# Patient Record
Sex: Male | Born: 1965 | Race: Black or African American | Hispanic: No | Marital: Single | Smoking: Current every day smoker
Health system: Southern US, Community
[De-identification: ages and names within clinical notes are randomized; demographics above are authoritative.]

---

## 2000-02-15 ENCOUNTER — Emergency Department (HOSPITAL_COMMUNITY): Admission: EM | Admit: 2000-02-15 | Discharge: 2000-02-15 | Payer: Self-pay | Admitting: Emergency Medicine

## 2000-02-15 ENCOUNTER — Encounter: Payer: Self-pay | Admitting: Emergency Medicine

## 2001-08-23 ENCOUNTER — Emergency Department (HOSPITAL_COMMUNITY): Admission: EM | Admit: 2001-08-23 | Discharge: 2001-08-23 | Payer: Self-pay | Admitting: *Deleted

## 2011-01-30 ENCOUNTER — Emergency Department (HOSPITAL_COMMUNITY)
Admission: EM | Admit: 2011-01-30 | Discharge: 2011-01-30 | Disposition: A | Discharge status: Home / Self Care | Payer: Self-pay | Attending: Emergency Medicine | Admitting: Emergency Medicine

## 2011-01-30 DIAGNOSIS — K0889 Other specified disorders of teeth and supporting structures: Secondary | ICD-10-CM | POA: Insufficient documentation

## 2011-01-30 DIAGNOSIS — K089 Disorder of teeth and supporting structures, unspecified: Secondary | ICD-10-CM | POA: Insufficient documentation

## 2011-01-30 DIAGNOSIS — K029 Dental caries, unspecified: Secondary | ICD-10-CM | POA: Insufficient documentation

## 2011-09-12 ENCOUNTER — Encounter (HOSPITAL_COMMUNITY): Payer: Self-pay

## 2011-09-12 ENCOUNTER — Emergency Department (HOSPITAL_COMMUNITY)
Admission: EM | Admit: 2011-09-12 | Discharge: 2011-09-12 | Disposition: A | Discharge status: Other Acute Inpatient Hospital | Payer: Self-pay | Attending: Internal Medicine | Admitting: Internal Medicine

## 2011-09-12 ENCOUNTER — Emergency Department (HOSPITAL_COMMUNITY): Payer: Self-pay

## 2011-09-12 ENCOUNTER — Inpatient Hospital Stay (HOSPITAL_COMMUNITY)
Admission: AD | Admit: 2011-09-12 | Discharge: 2011-09-14 | DRG: 066 | Disposition: A | Discharge status: Home / Self Care | Payer: Self-pay | Source: Other Acute Inpatient Hospital | Attending: Internal Medicine | Admitting: Internal Medicine

## 2011-09-12 DIAGNOSIS — R262 Difficulty in walking, not elsewhere classified: Secondary | ICD-10-CM | POA: Insufficient documentation

## 2011-09-12 DIAGNOSIS — E876 Hypokalemia: Secondary | ICD-10-CM | POA: Diagnosis present

## 2011-09-12 DIAGNOSIS — Z7982 Long term (current) use of aspirin: Secondary | ICD-10-CM

## 2011-09-12 DIAGNOSIS — R112 Nausea with vomiting, unspecified: Secondary | ICD-10-CM | POA: Diagnosis present

## 2011-09-12 DIAGNOSIS — R4182 Altered mental status, unspecified: Secondary | ICD-10-CM | POA: Insufficient documentation

## 2011-09-12 DIAGNOSIS — I6350 Cerebral infarction due to unspecified occlusion or stenosis of unspecified cerebral artery: Principal | ICD-10-CM | POA: Diagnosis present

## 2011-09-12 DIAGNOSIS — F17200 Nicotine dependence, unspecified, uncomplicated: Secondary | ICD-10-CM | POA: Diagnosis present

## 2011-09-12 DIAGNOSIS — H814 Vertigo of central origin: Secondary | ICD-10-CM | POA: Insufficient documentation

## 2011-09-12 LAB — COMPREHENSIVE METABOLIC PANEL
ALT: 21 U/L (ref 0–53)
Alkaline Phosphatase: 46 U/L (ref 39–117)
CO2: 23 mEq/L (ref 19–32)
Chloride: 105 mEq/L (ref 96–112)
GFR calc Af Amer: >90 mL/min (ref >90)
Glucose, Bld: 177 mg/dL — ABNORMAL HIGH (ref 70–99)
Potassium: 3.4 mEq/L — ABNORMAL LOW (ref 3.5–5.1)
Sodium: 139 mEq/L (ref 135–145)
Total Protein: 5.6 g/dL — ABNORMAL LOW (ref 6.0–8.3)

## 2011-09-12 LAB — CBC
HCT: 42.2 % (ref 39.0–52.0)
Hemoglobin: 14.2 g/dL (ref 13.0–17.0)
MCH: 27.5 pg (ref 26.0–34.0)
MCHC: 33.6 g/dL (ref 30.0–36.0)
MCV: 81.8 fL (ref 78.0–100.0)
Platelets: 167 10*3/uL (ref 150–400)
RBC: 5.16 MIL/uL (ref 4.22–5.81)
RDW: 13.2 % (ref 11.5–15.5)
WBC: 7.3 10*3/uL (ref 4.0–10.5)

## 2011-09-12 LAB — DIFFERENTIAL
Basophils Absolute: 0 10*3/uL (ref 0.0–0.1)
Basophils Relative: 1 % (ref 0–1)
Eosinophils Absolute: 0.2 10*3/uL (ref 0.0–0.7)
Eosinophils Relative: 2 % (ref 0–5)
Lymphocytes Relative: 24 % (ref 12–46)
Lymphs Abs: 1.8 10*3/uL (ref 0.7–4.0)
Monocytes Absolute: 0.8 10*3/uL (ref 0.1–1.0)
Monocytes Relative: 11 % (ref 3–12)
Neutro Abs: 4.5 10*3/uL (ref 1.7–7.7)
Neutrophils Relative %: 62 % (ref 43–77)

## 2011-09-12 LAB — URINALYSIS, ROUTINE W REFLEX MICROSCOPIC
Bilirubin Urine: NEGATIVE
Hgb urine dipstick: NEGATIVE
Specific Gravity, Urine: 1.025 (ref 1.005–1.030)
Urobilinogen, UA: 1 mg/dL (ref 0.0–1.0)
pH: 6 (ref 5.0–8.0)

## 2011-09-12 LAB — RAPID URINE DRUG SCREEN, HOSP PERFORMED
Barbiturates: NOT DETECTED
Benzodiazepines: NOT DETECTED
Cocaine: NOT DETECTED
Tetrahydrocannabinol: NOT DETECTED

## 2011-09-12 LAB — CARDIAC PANEL(CRET KIN+CKTOT+MB+TROPI)
CK, MB: 6.3 ng/mL (ref 0.3–4.0)
Troponin I: <0.3 ng/mL (ref <0.30)

## 2011-09-12 LAB — SEDIMENTATION RATE: Sed Rate: 2 mm/hr (ref 0–16)

## 2011-09-12 LAB — SALICYLATE LEVEL: Salicylate Lvl: <2 mg/dL — ABNORMAL LOW (ref 2.8–20.0)

## 2011-09-12 LAB — ACETAMINOPHEN LEVEL: Acetaminophen (Tylenol), Serum: <15 ug/mL (ref 10–30)

## 2011-09-12 MED ORDER — GADOBENATE DIMEGLUMINE 529 MG/ML IV SOLN
15.0000 mL | Freq: Once | INTRAVENOUS | Status: AC | PRN
  Administered 2011-09-12: 15 mL via INTRAVENOUS

## 2011-09-13 LAB — HOMOCYSTEINE: Homocysteine: 5.7 umol/L (ref 4.0–15.4)

## 2011-09-13 LAB — COMPREHENSIVE METABOLIC PANEL
AST: 19 U/L (ref 0–37)
BUN: 10 mg/dL (ref 6–23)
CO2: 24 mEq/L (ref 19–32)
Chloride: 111 mEq/L (ref 96–112)
Creatinine, Ser: 0.91 mg/dL (ref 0.50–1.35)
GFR calc non Af Amer: >90 mL/min (ref >90)
Total Bilirubin: 0.1 mg/dL — ABNORMAL LOW (ref 0.3–1.2)

## 2011-09-13 LAB — CBC
HCT: 37.2 % — ABNORMAL LOW (ref 39.0–52.0)
MCHC: 33.6 g/dL (ref 30.0–36.0)
MCV: 81 fL (ref 78.0–100.0)
RBC: 4.59 MIL/uL (ref 4.22–5.81)
WBC: 6.3 10*3/uL (ref 4.0–10.5)

## 2011-09-13 LAB — GLUCOSE, CAPILLARY: Glucose-Capillary: 97 mg/dL (ref 70–99)

## 2011-09-13 LAB — LIPID PANEL
Cholesterol: 127 mg/dL (ref 0–200)
HDL: 43 mg/dL (ref >39)
LDL Cholesterol: 71 mg/dL (ref 0–99)
Triglycerides: 63 mg/dL (ref <150)

## 2011-09-15 LAB — LUPUS ANTICOAGULANT PANEL
DRVVT: 38.9 secs (ref 34.1–42.2)
PTT Lupus Anticoagulant: 39.9 secs (ref 30.0–45.6)

## 2011-09-15 LAB — PROTEIN S ACTIVITY: Protein S Activity: 78 % (ref 69–129)

## 2011-09-15 LAB — BETA-2-GLYCOPROTEIN I ABS, IGG/M/A
Beta-2 Glyco I IgG: 0 G Units (ref <20)
Beta-2-Glycoprotein I IgA: 3 A Units (ref <20)
Beta-2-Glycoprotein I IgM: 2 M Units (ref <20)

## 2011-09-15 LAB — PROTEIN C ACTIVITY: Protein C Activity: 188 % — ABNORMAL HIGH (ref 75–133)

## 2011-09-15 LAB — ANA: Anti Nuclear Antibody(ANA): NEGATIVE

## 2011-09-15 LAB — ANTITHROMBIN III: AntiThromb III Func: 119 % (ref 76–126)

## 2011-09-15 LAB — CARDIOLIPIN ANTIBODIES, IGG, IGM, IGA: Anticardiolipin IgG: 3 GPL U/mL — ABNORMAL LOW (ref <23)

## 2011-09-17 LAB — FACTOR 5 LEIDEN

## 2011-09-19 LAB — CULTURE, BLOOD (ROUTINE X 2)
Culture  Setup Time: 201210130150
Culture: NO GROWTH

## 2011-09-26 NOTE — Discharge Summary (Signed)
NAMEDAARON, [name]:  [id number]  MEDICAL RECORD NO.:  [id number]  LOCATION:  3013                         FACILITY:  [facility]  PHYSICIAN:  Peggye Pitt, M.D. DATE OF BIRTH:  [birth year]  DATE OF ADMISSION:  09/12/2011 DATE OF DISCHARGE:  09/14/2011                              DISCHARGE SUMMARY   PRIMARY CARE PHYSICIAN:  He is currently unassigned.  DISCHARGE DIAGNOSES: 1. Acute cerebellar cerebrovascular accident. 2. Dizziness secondary to cerebrovascular accident, resolved. 3. Nausea and vomiting secondary to cerebrovascular accident,     resolved.  DISCHARGE MEDICATIONS:  Include aspirin 81 mg daily.  DISPOSITION AND FOLLOWUP:  Cameron Williams will be discharged home today in stable and improved condition.  He requires no PT, OT, or ST followup. His 2-D echo has been done; however, results are still pending.  He currently does not have a primary care physician.  I have asked case management to see him prior to discharge to help him find a new primary care physician who can help to follow up on these results.  CONSULTATION THIS HOSPITALIZATION:  [name], MD with Neurology.  IMAGES THIS HOSPITALIZATION:  Include a CT scan of the head on September 12, 2011 that showed mild white matter hypodensities, a nonspecific finding often seen with chronic microangiopathic change.  No definite acute intracranial abnormality.  An MRI of the brain on September 12, 2011 that showed 3 focal areas of acute/subacute nonhemorrhagic infarction involving the right cerebellum.  Extensive subcortical FLAIR hyperintensities within the frontoparietal lobes bilaterally.  The finding is nonspecific, but can be seen in the setting of chronic microvascular ischemia, demyelinating process such as multiple sclerosis, vasculitis, complicated migraines or the sequelae of prior infectious or inflammatory process.  There is also a right maxillary sinusitis.  An MRA of the head  that showed no significant proximal stenosis or branch vessel occlusion.  An MRA of the neck that was negative.  HISTORY AND PHYSICAL:  For full details, please refer to dictation on September 12, 2011 by Dr. Sunnie Nielsen.  However, in brief, Cameron Williams is a 45 year old black gentleman who presented to the hospital with complaints of dizziness, nausea, vomiting.  He also had some difficulty walking, leaning towards the left.  He felt like everything was spinning around him.  His symptoms probably resolved; however, he came into the emergency department while he was diagnosed with an acute CVA and we were asked to admit him for further evaluation and management.  HOSPITAL COURSE BY PROBLEM: 1. Acute cerebellar CVA.  This could certainly account for his     symptoms of dizziness, nausea, and vomiting.  So far, his workup     has been negative.  His fasting lipid panel has an LDL of 71, hence     he does not require any statins at this point.  HIV has been     negative, preliminary vasculitic workup including ESR and ANA are     negative.  A hypercoagulable panel has been ordered, however, is     pending at this time.  His MRI of neck did not show any evidence     for carotid obstruction.  A 2-D echo has been  done; however,     results are pending at the time of discharge.  He has been     evaluated by PT, OT, and speech therapy.  He does not have any     deficits and does not require any outpatient followup.  He has been     started on aspirin 81 mg daily, which he was not taking     prior to admission. 2. Vital signs on the day of discharge, blood pressure 115/70, heart     rate 59, respirations 16, sats are 98% on room air with temp of     98.5.     Peggye Pitt, M.D.     EH/MEDQ  D:  09/14/2011  T:  09/14/2011  Job:  [id number]  cc:   [name], MD  Electronically Signed by Peggye Pitt M.D. on 09/26/2011 03:47:42 PM

## 2011-10-01 NOTE — H&P (Signed)
Cameron Williams, Cameron NO.:  Williams  MEDICAL RECORD NO.:  [id number]  LOCATION:  WLED                         FACILITY:  [facility]  PHYSICIAN:  Hartley Barefoot, MD    DATE OF BIRTH:  [birth year]  DATE OF ADMISSION:  09/12/2011 DATE OF DISCHARGE:                             HISTORY & PHYSICAL   CHIEF COMPLAINT:  Dizziness.  HISTORY OF PRESENT ILLNESS:  This is a 45 year old with no significant past medical history, who presented to the emergency department complaining of dizziness.  He related that when he wake up in the morning, he went to the bathroom and when he was walking, he become dizzy, leaning to the left side, unable to walk.  He related that everything was spinning around him.  He started to have some vomiting. He called his sister for help.  He felt that he lost his balance and he was not able to stop vomiting.  That was the history that he described to the emergency department.  Then he relate to me that he wake up around 2:00 a.m. and he went to urinate and then he went back to his bed and lie down, then he stand up again and he was having abdominal pain and then he started to walk and he started to go into the left side, having dizziness and poor balance.  Denies speech problem.  He said that he was talking slow because he did not want to vomit.  He felt that everything was moving around him.  He denies any headaches.  His abdominal pain has resolved.  The nausea and vomiting has also resolved.  ALLERGIES:  No known drug allergies.  PAST MEDICAL HISTORY:  Enlarged prostate gland.  MEDICATIONS:  None, although he took a pain medication from his wife the night prior to admission.  SOCIAL HISTORY:  He smokes half a pack per day for the last 6 years.  He drinks 8 beers per month.  No recreational drugs.  FAMILY HISTORY:  No history of stroke or clotting disorder.  He does not know any history about his father.  He does not have any children.   He has 2 sisters and they are healthy.  He is unemployed, but he is looking for job.  PHYSICAL EXAMINATION:  VITAL SIGNS:  Blood pressure 115/64, pulse 70, respiration 18, temperature 97.7, and saturating 98% on room air. GENERAL:  Sitting in bed in no acute distress. HEENT:  Head atraumatic and normocephalic.  Eyes anicteric.  Pupils equal, reactive to light.  Extraocular muscles intact.  Visual fields normal.  Face symmetrical.  Tongue midline.  Uvula is midline.  No dysarthria or aphasia. CARDIOVASCULAR:  S1, S2.  Regular rhythm and rate.  No rubs, murmurs, or gallops. LUNGS:  Bilateral good air movement.  No wheezing, crackles or rhonchi. ABDOMEN:  Bowel sounds positive.  Soft, nontender, nondistended. EXTREMITY:  No edema. NEUROLOGIC:  Awake and oriented x3.  Cranial nerves 2 through 12 intact. Sensation grossly intact.  Motor strength 5/5.  He feels that his balance problem is better, no more dizziness and improved since 2 hours ago.  It seems that when Neurology saw him, he was still having some  balance problems.  ADMISSION LABS:  UDS positive for opioids.  UA negative.  Alcohol less than 11.  Sodium 139, potassium 3.4, chloride 105, bicarb 23, glucose 177, BUN 14, creatinine 1.07, bilirubin 0.1, alkaline phosphatase 46, AST 31, ALT 21, total protein 5.6, albumin 3.0, calcium 8.8, salicylate less than 2, Tylenol less than 50.  White blood cell 7.3, hemoglobin 14.2, platelets 167, ANC 4.5.  CT head show mild white matter hypodensity and nonspecific finding often seen with chronic microangiopathic changes.  No definite acute intracranial abnormality identified.  Partially opacified right maxillary sinus, correlate clinically if concern for acute sinusitis. 1. MRA of the neck, negative MRA of the neck. 2. MRA of the head; no significant proximal stenosis or branch vessel     occlusion.  Tiny out portion of the right posterior communicating     artery may represent a less than 1  mm aneurysm.  Tiny infundibulum     of the left posterior communicating artery. 3. MRI of the brain; 3 focal area of acute, subacute, nonhemorrhagic     infarct involving the right cerebellum.  Extensive subcortical     T2/FLAIR hyperintensity within the frontal parietal lobes     bilaterally.  The finding is nonspecific, but can be seen in the     setting of chronic microvascular ischemia, demyelinating process     such as multiple sclerosis, vasculitis, complicated migraine.  ASSESSMENT AND PLAN: 1. Acute subacute nonhemorrhagic cerebellum stroke.  We will admit the     patient to neuro telemetry.  We will do a workup for a stroke.  We     will check hypercoagulable panel due to young age of presentation.     I will check ESR and ANA for autoimmune disease.  We will also check 2D echo     carotid Dopplers, hemoglobin A1c, fasting lipid panel.  We will     start aspirin.  Neurology already consulted.  PT/OT and speech and     swallow evaluation. 2. Hypokalemia.  We will replete with 20 mEq.  We will check mag     level. 3. For deep venous thrombosis prophylaxis, Lovenox.     Hartley Barefoot, MD     BR/MEDQ  D:  09/12/2011  T:  09/12/2011  Job:  [id number]  Electronically Signed by Hartley Barefoot MD on 10/01/2011 08:22:23 PM

## 2011-10-01 NOTE — Consult Note (Signed)
Cameron Williams, [name] NO.:  [id number]  MEDICAL RECORD NO.:  [id number]  LOCATION:  WLED                         FACILITY:  [facility]  PHYSICIAN:  [name], MD   DATE OF BIRTH:  12-17-1965  DATE OF CONSULTATION: DATE OF DISCHARGE:                                CONSULTATION   This note is dictated on behalf of Dr. [name].  TIME:  9:00.  REASON FOR CONSULTATION:  Ataxia.  HISTORY OF PRESENT ILLNESS:  This is a 45 year old African American male who has no past medical history other than a recently diagnosed enlarged prostate.  The patient is on no medications.  However, he does state that he does takes his wife's narcotics for his joint pain.  Last night, he was at his baseline, went to sleep.  When he got up early in the morning to go to the bathroom, he noted that he felt very much off balance.  He also felt significantly diaphoreticand noted tunnel vision followed by an episode of emesis.  After the episode of emesis, the patient's wife called EMS.  Since in ED, he feels significantly improved; however, he states his gait is still off.  He describes a feeling of vereing to the LEFT.  He denies any room spinning, any auditory rushing or auditory sounds. He denies any vertiginous sensation. On exam, the patient shows no localizing neurological abnormalities; however, when walking with gait, he is able to walk without any difficulty, but when asked to stand on one foot he has slight difficulty with left foot, but does not need to touch down with the either foot.  With heel-to-shin, I note that he will sway back and forth, but is able to keep his balance without any difficulty.  PAST MEDICAL HISTORY:  As noted above.  MEDICATIONS:  He is on no home medications, but does state he takes his wife's narcotics intermittently.  ALLERGIES:  No known drug allergies.  SOCIAL HISTORY:  He smokes 1 pack per day.  He does drink alcohol.  He denies  illicit drugs.  He lives with his wife and is going through a divorce.  REVIEW OF SYSTEMS:  Negative.  PHYSICAL EXAMINATION:  VITAL SIGNS:  Blood pressure is 115/64, pulse 70, respirations 18, temperature 97.7. GENERAL:  The patient is alert and oriented x3.  Carries out 2-step commands without any difficulty.  Pupils are equal, round, reactive to light and accommodating conjugate.  Extraocular movements are intact. Visual fields grossly intact.  Face symmetrical.  Tongue is midline. Uvula is midline.  The patient shows no dysarthria or aphasia.  Facial sensation V1 through V3 is full.  The shoulder shrug, head turn is within normal limits. COORDINATION:  Finger-to-nose, heel-to-shin are smooth.  Fine motor movements are smooth.  Motor 5/5 throughout.  Deep tendon reflexes are 2+ throughout, downgoing toes.  Drift is negative.  Sensation is full to pinprick, light touch.  On gait, the patient has a narrow based gait bilaterally.  He is able to stand on one foot without touching down with the other foot.  His heel-to-toe is slightly off showing an astasia- abasia like quality  where he will sway from side to  side, but does not actually need to step sideways.  He only stepped sideways once during the exam.  LABS:  The patient was positive for opiates.  Urinalysis was negative. Sodium 139, potassium 3.4, chloride 105, CO2 was 23, BUN 14, creatinine 1.07, glucose 117.  White blood cell count is 7.3, hemoglobin is 14.2, hematocrit 42.2, platelets 132.  Imaging, head CT was negative.  ASSESSMENT:  This is a 45 year old male with balance difficulty, nausea, vomiting, presyncopal event early in the morning upon wakening.  The patient's symptoms have all but resolved with the exception of some gait difficulties.  The patient denies any vertiginous sensation, any numbness, tingling.  He denies any auditory abnormalities.  DIFFERENTIAL DIAGNOSIS:  Includes possible posterior  vertebrobasilar pathology versus intracranial pathology.  RECOMMENDATIONS: 1. Agree with MRI, MRA of brain, MRA of neck to rule out intracranial     pathology, possible vertebrobasilar insufficiency or posterior     circulation stroke. 2. PT to evaluate gait.     [name], PA-C    I evaluated patient and agree with assessment and plan.    ______________________________ [name], MD    DS/MEDQ  D:  09/12/2011  T:  09/12/2011  Job:  [id number]  Electronically Signed by [name] PA-C on 09/22/2011 01:07:26 PM Electronically Signed by [name]  on 10/01/2011 09:50:48 AM

## 2013-11-26 ENCOUNTER — Emergency Department (HOSPITAL_COMMUNITY): Payer: Worker's Compensation

## 2013-11-26 ENCOUNTER — Encounter (HOSPITAL_COMMUNITY): Payer: Self-pay | Admitting: Emergency Medicine

## 2013-11-26 ENCOUNTER — Emergency Department (HOSPITAL_COMMUNITY)
Admission: EM | Admit: 2013-11-26 | Discharge: 2013-11-26 | Disposition: A | Discharge status: Home / Self Care | Payer: Worker's Compensation | Attending: Emergency Medicine | Admitting: Emergency Medicine

## 2013-11-26 DIAGNOSIS — S62639A Displaced fracture of distal phalanx of unspecified finger, initial encounter for closed fracture: Secondary | ICD-10-CM | POA: Insufficient documentation

## 2013-11-26 DIAGNOSIS — W208XXA Other cause of strike by thrown, projected or falling object, initial encounter: Secondary | ICD-10-CM | POA: Insufficient documentation

## 2013-11-26 DIAGNOSIS — F17200 Nicotine dependence, unspecified, uncomplicated: Secondary | ICD-10-CM | POA: Insufficient documentation

## 2013-11-26 DIAGNOSIS — Y9289 Other specified places as the place of occurrence of the external cause: Secondary | ICD-10-CM | POA: Insufficient documentation

## 2013-11-26 DIAGNOSIS — Y9389 Activity, other specified: Secondary | ICD-10-CM | POA: Insufficient documentation

## 2013-11-26 DIAGNOSIS — S61209A Unspecified open wound of unspecified finger without damage to nail, initial encounter: Secondary | ICD-10-CM | POA: Insufficient documentation

## 2013-11-26 DIAGNOSIS — Y990 Civilian activity done for income or pay: Secondary | ICD-10-CM | POA: Insufficient documentation

## 2013-11-26 MED ORDER — CEPHALEXIN 500 MG PO CAPS
500.0000 mg | ORAL_CAPSULE | Freq: Once | ORAL | Status: AC
  Administered 2013-11-26: 500 mg via ORAL
  Filled 2013-11-26: qty 1

## 2013-11-26 MED ORDER — CEPHALEXIN 500 MG PO CAPS: 500.0000 mg | ORAL_CAPSULE | Freq: Four times a day (QID) | ORAL | Status: DC

## 2013-11-26 MED ORDER — OXYCODONE-ACETAMINOPHEN 5-325 MG PO TABS: 1.0000 | ORAL_TABLET | Freq: Four times a day (QID) | ORAL | Status: DC | PRN

## 2013-11-26 MED ORDER — HYDROCODONE-ACETAMINOPHEN 5-325 MG PO TABS
1.0000 | ORAL_TABLET | Freq: Once | ORAL | Status: AC | PRN
  Administered 2013-11-26: 1 via ORAL
  Filled 2013-11-26: qty 1

## 2013-11-26 MED ORDER — OXYCODONE-ACETAMINOPHEN 5-325 MG PO TABS
1.0000 | ORAL_TABLET | Freq: Once | ORAL | Status: AC
  Administered 2013-11-26: 1 via ORAL
  Filled 2013-11-26: qty 1

## 2013-11-26 MED ORDER — CHLORHEXIDINE GLUCONATE 4 % EX LIQD
Freq: Once | CUTANEOUS | Status: DC
  Filled 2013-11-26 (×2): qty 60

## 2013-11-26 MED ORDER — NAPROXEN 500 MG PO TABS: 500.0000 mg | ORAL_TABLET | Freq: Two times a day (BID) | ORAL | Status: DC

## 2013-11-26 NOTE — ED Provider Notes (Signed)
CSN: [id number]     Arrival date & time 11/26/13  1316 History  This chart was scribed for non-physician practitioner, Rhea Bleacher, PA-C working with Junius Argyle, MD by Greggory Stallion, ED scribe. This patient was seen in room WTR5/WTR5 and the patient's care was started at 3:18 PM.   Chief Complaint  Patient presents with  . Finger Injury    rt   The history is provided by the patient. No language interpreter was used.   HPI Comments: Cameron Williams is a 47 y.o. male who presents to the Emergency Department complaining of right second, third and fourth finger injury that occurred earlier today at work. States a 750 pound frame fell onto his finger. He has sudden onset finger pain with associated swelling.   History reviewed. No pertinent past medical history. History reviewed. No pertinent past surgical history. No family history on file. History  Substance Use Topics  . Smoking status: Current Every Day Smoker  . Smokeless tobacco: Not on file  . Alcohol Use: Yes    Review of Systems  Constitutional: Positive for activity change.  Musculoskeletal: Positive for arthralgias. Negative for back pain, gait problem, joint swelling and neck pain.  Skin: Positive for wound.  Neurological: Negative for weakness and numbness.    Allergies  Review of patient's allergies indicates no known allergies.  Home Medications   Current Outpatient Rx  Name  Route  Sig  Dispense  Refill  . HYDROcodone-acetaminophen (LORTAB) 7.5-500 MG per tablet   Oral   Take 1 tablet by mouth every 6 (six) hours as needed for pain.         Marland Kitchen ibuprofen (ADVIL,MOTRIN) 200 MG tablet   Oral   Take 800 mg by mouth every 6 (six) hours as needed for mild pain or moderate pain.          BP 138/101  Pulse 65  Temp(Src) 98.6 F (37 C) (Oral)  Resp 20  Wt 160 lb (72.576 kg)  SpO2 95%  Physical Exam  Nursing note and vitals reviewed. Constitutional: He appears well-developed and well-nourished.  No distress.  HENT:  Head: Normocephalic and atraumatic.  Eyes: Conjunctivae and EOM are normal.  Neck: Normal range of motion. Neck supple. No tracheal deviation present.  Cardiovascular: Normal rate and normal pulses.   Pulmonary/Chest: Effort normal. No respiratory distress.  Musculoskeletal: He exhibits edema and tenderness.       Right elbow: Normal.      Right wrist: Normal. He exhibits normal range of motion, no tenderness and no bony tenderness.       Right hand: He exhibits tenderness, bony tenderness, laceration and swelling. He exhibits normal range of motion and normal capillary refill. Normal sensation noted. Normal strength noted.       Hands: Tenderness and swelling of right index, long, and ring fingertips. There are small (<55mm, non-gaping) lacerations overlying the ulnar aspect of the long finger, and radial aspect of the ring finger, lateral to nailbed, not involving nailbed. Nail plates are intact. Mild oozing. Appear clean however it is impossible to gauge depth given small size. Open fracture is unlikely but cannot be ruled out. Normal cap refill. Patient has ROM with pain.   Neurological: He is alert. No sensory deficit.  Motor, sensation, and vascular distal to the injury is fully intact.   Skin: Skin is warm and dry.  Psychiatric: He has a normal mood and affect. His behavior is normal.    ED Course  Procedures (  including critical care time)  DIAGNOSTIC STUDIES: Oxygen Saturation is 95% on RA, adequate by my interpretation.    COORDINATION OF CARE: 3:19 PM-Discussed treatment plan which includes antibiotic, pain medication and finger splints with pt at bedside and pt agreed to plan.   Labs Review Labs Reviewed - No data to display Imaging Review Dg Hand Complete Right  11/26/2013   CLINICAL DATA:  Laceration right ring finger.  EXAM: RIGHT HAND - COMPLETE 3+ VIEW  COMPARISON:  None.  FINDINGS: The patient has fractures of the tufts of the right long and ring  fingers. No foreign body is identified. No other acute bony or joint abnormality is seen.  IMPRESSION: Fractures of the tufts of the long and ring fingers on the right.   Electronically Signed   By: Drusilla Kanner M.D.   On: 11/26/2013 15:04    EKG Interpretation   None      Patient seen and examined. Wounds cleaned. Pt informed of x-ray results. Medications ordered including keflex and pain medication. Splints by ortho tech.   Vital signs reviewed and are as follows: Filed Vitals:   11/26/13 1352  BP: 138/101  Pulse: 65  Temp: 98.6 F (37 C)  Resp: 20   Patient counseled on wound care. Patient counseled to f/u with Dr. Merlyn Lot given possibility of open fracture to ensure proper wound and fracture healing.  Patient was urged to return to the Emergency Department urgently with worsening pain, swelling, expanding erythema especially if it streaks away from the affected area, fever, or if they have any other concerns. Patient verbalized understanding.   Patient counseled on use of narcotic pain medications. Counseled not to combine these medications with others containing tylenol. Urged not to drink alcohol, drive, or perform any other activities that requires focus while taking these medications. The patient verbalizes understanding and agrees with the plan.   MDM   1. Closed fracture of tuft of distal phalanx of finger, initial encounter    Tuft fractures: definitive fracture care provided with finger splinting.   Small lacerations: Cannot rule out open fracture per above. These wounds are non-gaping and there is so much swelling I do not feel placement of a small suture would be beneficial at this point. The suture would have to be pulled very tightly and I do not want to force wound edges together at the expense of tissue damage. Wounds will likely heal well but patient can be considered for delayed primary closure by orthopedist.   I personally performed the services described in  this documentation, which was scribed in my presence. The recorded information has been reviewed and is accurate.   Renne Crigler, PA-C 11/26/13 1720

## 2013-11-26 NOTE — ED Notes (Signed)
wound

## 2013-11-26 NOTE — ED Notes (Signed)
PTA 750# frame come down on rt fingers, digits 2,3&4 involved, digits 3 & 4 bleeding, controlled

## 2013-11-27 NOTE — ED Provider Notes (Signed)
Medical screening examination/treatment/procedure(s) were performed by non-physician practitioner and as supervising physician I was immediately available for consultation/collaboration.  EKG Interpretation   None         Mayola Mcbain S Latrelle Fuston, MD 11/27/13 1201 

## 2013-12-01 ENCOUNTER — Emergency Department (HOSPITAL_COMMUNITY): Payer: Worker's Compensation

## 2013-12-01 ENCOUNTER — Emergency Department (HOSPITAL_COMMUNITY)
Admission: EM | Admit: 2013-12-01 | Discharge: 2013-12-01 | Disposition: A | Discharge status: Home / Self Care | Payer: Worker's Compensation | Attending: Emergency Medicine | Admitting: Emergency Medicine

## 2013-12-01 ENCOUNTER — Encounter (HOSPITAL_COMMUNITY): Payer: Self-pay | Admitting: Emergency Medicine

## 2013-12-01 DIAGNOSIS — Z792 Long term (current) use of antibiotics: Secondary | ICD-10-CM | POA: Insufficient documentation

## 2013-12-01 DIAGNOSIS — M25549 Pain in joints of unspecified hand: Secondary | ICD-10-CM | POA: Insufficient documentation

## 2013-12-01 DIAGNOSIS — F17200 Nicotine dependence, unspecified, uncomplicated: Secondary | ICD-10-CM | POA: Insufficient documentation

## 2013-12-01 DIAGNOSIS — M79644 Pain in right finger(s): Secondary | ICD-10-CM

## 2013-12-01 DIAGNOSIS — R209 Unspecified disturbances of skin sensation: Secondary | ICD-10-CM | POA: Insufficient documentation

## 2013-12-01 DIAGNOSIS — G8911 Acute pain due to trauma: Secondary | ICD-10-CM | POA: Insufficient documentation

## 2013-12-01 DIAGNOSIS — Z791 Long term (current) use of non-steroidal anti-inflammatories (NSAID): Secondary | ICD-10-CM | POA: Insufficient documentation

## 2013-12-01 MED ORDER — OXYCODONE-ACETAMINOPHEN 5-325 MG PO TABS: 1.0000 | ORAL_TABLET | ORAL | Status: DC | PRN

## 2013-12-01 MED ORDER — OXYCODONE-ACETAMINOPHEN 5-325 MG PO TABS
1.0000 | ORAL_TABLET | Freq: Once | ORAL | Status: AC
  Administered 2013-12-01: 1 via ORAL
  Filled 2013-12-01: qty 1

## 2013-12-01 NOTE — ED Notes (Signed)
Pt arrived to tED with a complaint of finger pain.  Pt crushed fingers under a large object on Saturday last.  Pt was seen here and at orthopedics who states nail possibly needed to be removed.  Pt didn't have the nails removed and pressure has built to a painful level.

## 2013-12-01 NOTE — ED Provider Notes (Signed)
CSN: [id number]     Arrival date & time 12/01/13  1938 History   None    This chart was scribed for non-physician practitioner, Ruby Colaatherine Smitty Ackerley PA-C working with Flint MelterElliott L Wentz, MD by Arlan OrganAshley Leger, ED Scribe. This patient was seen in room WTR9/WTR9 and the patient's care was started at 9:00 PM.   Chief Complaint  Patient presents with  . Hand Pain   The history is provided by the patient. No language interpreter was used.    HPI Comments: Cameron Williams is a 48 y.o. male who presents to the Emergency Department complaining of a finger pain that initially occurred 5 days ago. Pt states he crushed his finger underneath a large object. He says he was seen here 3 days ago, and by Dr. Nicki ReaperGary R. Kuzma, MD and was told his finger nail may possibly need to be removed. He says he is now experiencing increased gradually worsening constant pain to his fingers due to the pressure underneath his nail along with associated increased swelling and numbness onset 2 days ago. He denies fever or chills. Pt says he was put on Keflex, and has been taking it as prescribed. He reports having about 8 pills remaining.  History reviewed. No pertinent past medical history. History reviewed. No pertinent past surgical history. History reviewed. No pertinent family history. History  Substance Use Topics  . Smoking status: Current Every Day Smoker  . Smokeless tobacco: Not on file  . Alcohol Use: Yes    Review of Systems  All other systems reviewed and are negative.    Allergies  Review of patient's allergies indicates no known allergies.  Home Medications   Current Outpatient Rx  Name  Route  Sig  Dispense  Refill  . cephALEXin (KEFLEX) 500 MG capsule   Oral   Take 1 capsule (500 mg total) by mouth 4 (four) times daily.   28 capsule   0   . HYDROcodone-acetaminophen (LORTAB) 7.5-500 MG per tablet   Oral   Take 1 tablet by mouth every 6 (six) hours as needed for pain.         Marland Kitchen. ibuprofen  (ADVIL,MOTRIN) 200 MG tablet   Oral   Take 800 mg by mouth every 6 (six) hours as needed for mild pain or moderate pain.         . naproxen (NAPROSYN) 500 MG tablet   Oral   Take 1 tablet (500 mg total) by mouth 2 (two) times daily.   20 tablet   0   . oxyCODONE-acetaminophen (PERCOCET/ROXICET) 5-325 MG per tablet   Oral   Take 1-2 tablets by mouth every 6 (six) hours as needed for severe pain.   20 tablet   0    Triage Vitals: BP 130/83  Pulse 66  Temp(Src) 97.7 F (36.5 C) (Oral)  Resp 18  SpO2 99%  Physical Exam  Nursing note and vitals reviewed. Constitutional: He is oriented to person, place, and time. He appears well-developed and well-nourished. No distress.  HENT:  Head: Normocephalic and atraumatic.  Eyes:  Normal appearance  Neck: Normal range of motion.  Pulmonary/Chest: Effort normal.  Musculoskeletal: Normal range of motion.  Healing lac on lateral surface of distal phalanx of ring finger and medial surface of distal phalanx of middle finger.  Erythema on pad of middle finger.  Edema of entire middle finger, worst at distal phalanx.  Tenderness on flexor surface of middle and distal phalanx.  Pt holding this finger in slight flexion,  but has pain w/ guarding w/ passive full extension.  Distal sensation intact.  Ring finger, mildly edematous and ttp at distal phalanx only. Minimal pain w/ ROM. Distal sensation intact.  Neurological: He is alert and oriented to person, place, and time.  Psychiatric: He has a normal mood and affect. His behavior is normal.    ED Course  Procedures (including critical care time)  DIAGNOSTIC STUDIES: Oxygen Saturation is 99% on RA, Normal by my interpretation.    COORDINATION OF CARE: 8:21 PM- Will give pain medication. Discussed treatment plan with pt at bedside and pt agreed to plan.     Labs Review Labs Reviewed - No data to display Imaging Review No results found.  EKG Interpretation   None       MDM   1.  Pain in finger of right hand    47yo healthy M had a crush injury to 3rd and 4th fingers 5 days ago, sustained tuft fractures and lacerations, followed up with Dr, Merlyn Lot 3 days ago and was told to continue Keflex, splint and prn percocet and return to office on 1/12.  Comes to ED today because pain and edema have worsened and there has been intermittent bleeding from lacs.  He has been unable to wear splint because pain w/ full extension of middle finger. Dr. Effie Shy has examined and does not have any concern for flexor tenosynovitis or other infectious process.  I advised patient to f/u w/ Dr. Merlyn Lot asap and return to ER immediately if edema, erythema or pain spreads, there is purulent drainage from wounds or he develops fever.  Prescribed percocet.     I personally performed the services described in this documentation, which was scribed in my presence. The recorded information has been reviewed and is accurate.   [name], PA-C 12/02/13 1946

## 2013-12-01 NOTE — ED Notes (Signed)
Pt was here on 12/27 for a laceration repair and is still in pain; says the finger braces he received are not working

## 2013-12-01 NOTE — ED Provider Notes (Signed)
  Face-to-face evaluation   History: Crush injury 11/26/2013 at work. He's been evaluated by hand surgery, who advised that he comes back 12/12/13 for a check up. He was treated with stack splints on the fingers, but they became uncomfortable, so he stopped using them. He has been soaking the wounds, and fingers twice each day as directed. He is taking his antibiotic. He is here because he is concerned about the wounds, and having pain. He was told that he could get back to work soon.  Physical exam: Fingers, 3 and 4, right hand were evaluated. There is mild swelling of the hands of each finger. There are no subungual hematomas. He has fair flexion at the PIP and DIPs of each of the and fingers there is no significant swelling or erythema along the flexor tendon sheaths of either finger. There is no proximal streaking.  Assessment: Reviewed imaging from prior visit. Exam today is consistent with resolving fractures and healing wounds. His exam does not indicate septic arthritis or tenosynovitis. He is stable for discharge with current treatment plan. There is no need for the stack splints because they are causing pain. They may be helpful after the swelling has improved. He is given precautions to watch for worsening symptoms and advised to continue soaking and aggressively elevate the right hand.  Medical screening examination/treatment/procedure(s) were conducted as a shared visit with non-physician practitioner(s) and myself.  I personally evaluated the patient during the encounter  Flint MelterElliott L Waldemar Siegel, MD 12/03/13 [phone]

## 2013-12-01 NOTE — Discharge Instructions (Signed)
Take percocet as needed for severe pain.  Do not drive within four hours of taking this medication (may cause drowsiness or confusion).   Continue antibiotic as prescribed.  Elevate and ice fingers when possible.  Follow up with Dr. Merlyn LotKuzma as scheduled.  Return to the ER immediately if finger continues to swell, redness spreads, pain worsens, there is drainage of pus from wounds or you develop a fever.

## 2014-02-17 ENCOUNTER — Emergency Department (HOSPITAL_COMMUNITY)
Admission: EM | Admit: 2014-02-17 | Discharge: 2014-02-17 | Disposition: A | Discharge status: Home / Self Care | Payer: Self-pay | Attending: Emergency Medicine | Admitting: Emergency Medicine

## 2014-02-17 ENCOUNTER — Encounter (HOSPITAL_COMMUNITY): Payer: Self-pay | Admitting: Emergency Medicine

## 2014-02-17 ENCOUNTER — Emergency Department (HOSPITAL_COMMUNITY): Payer: Self-pay

## 2014-02-17 DIAGNOSIS — R6889 Other general symptoms and signs: Secondary | ICD-10-CM

## 2014-02-17 DIAGNOSIS — R509 Fever, unspecified: Secondary | ICD-10-CM

## 2014-02-17 DIAGNOSIS — R300 Dysuria: Secondary | ICD-10-CM | POA: Insufficient documentation

## 2014-02-17 DIAGNOSIS — IMO0002 Reserved for concepts with insufficient information to code with codable children: Secondary | ICD-10-CM | POA: Insufficient documentation

## 2014-02-17 DIAGNOSIS — J111 Influenza due to unidentified influenza virus with other respiratory manifestations: Secondary | ICD-10-CM | POA: Insufficient documentation

## 2014-02-17 LAB — URINALYSIS, ROUTINE W REFLEX MICROSCOPIC
Bilirubin Urine: NEGATIVE
GLUCOSE, UA: NEGATIVE mg/dL
Hgb urine dipstick: NEGATIVE
KETONES UR: NEGATIVE mg/dL
LEUKOCYTES UA: NEGATIVE
Nitrite: NEGATIVE
PH: 6.5 (ref 5.0–8.0)
Protein, ur: 30 mg/dL — AB
Specific Gravity, Urine: 1.03 (ref 1.005–1.030)
Urobilinogen, UA: 1 mg/dL (ref 0.0–1.0)

## 2014-02-17 LAB — URINE MICROSCOPIC-ADD ON

## 2014-02-17 MED ORDER — PREDNISONE 20 MG PO TABS: 40.0000 mg | ORAL_TABLET | Freq: Every day | ORAL | Status: AC

## 2014-02-17 MED ORDER — ALBUTEROL SULFATE HFA 108 (90 BASE) MCG/ACT IN AERS
2.0000 | INHALATION_SPRAY | Freq: Once | RESPIRATORY_TRACT | Status: AC
  Administered 2014-02-17: 2 via RESPIRATORY_TRACT
  Filled 2014-02-17: qty 6.7

## 2014-02-17 MED ORDER — GUAIFENESIN 100 MG/5ML PO SYRP: 100.0000 mg | ORAL_SOLUTION | ORAL | Status: AC | PRN

## 2014-02-17 MED ORDER — IBUPROFEN 600 MG PO TABS: 600.0000 mg | ORAL_TABLET | Freq: Four times a day (QID) | ORAL | Status: AC | PRN

## 2014-02-17 MED ORDER — ACETAMINOPHEN 325 MG PO TABS
650.0000 mg | ORAL_TABLET | Freq: Once | ORAL | Status: AC
  Administered 2014-02-17: 650 mg via ORAL
  Filled 2014-02-17: qty 2

## 2014-02-17 NOTE — ED Provider Notes (Signed)
CSN: [id number]     Arrival date & time 02/17/14  2014 History  This chart was scribed for non-physician practitioner Antony Madura, PA-C working with Suzi Roots, MD by Dorothey Baseman, ED Scribe. This patient was seen in room WTR8/WTR8 and the patient's care was started at 10:19 PM.     Chief Complaint  Patient presents with  . Influenza   Patient is a 48 y.o. male presenting with flu symptoms. The history is provided by the patient. No language interpreter was used.  Influenza Presenting symptoms: cough, fatigue, fever, myalgias and shortness of breath   Presenting symptoms: no nausea, no rhinorrhea, no sore throat and no vomiting   Cough:    Cough characteristics:  Dry   Severity:  Mild   Onset quality:  Gradual   Timing:  Intermittent   Progression:  Unchanged   Chronicity:  New Fatigue:    Severity:  Moderate   Timing:  Constant   Progression:  Unchanged Fever:    Timing:  Sporadic   Progression:  Unchanged Shortness of breath:    Severity:  Mild   Onset quality:  Gradual   Timing:  Intermittent   Progression:  Unchanged Severity:  Moderate Onset quality:  Gradual Progression:  Unchanged Chronicity:  New Relieved by:  Nothing Worsened by:  Nothing tried Ineffective treatments:  OTC medications Associated symptoms: no congestion and no neck stiffness   Risk factors: no sick contacts    HPI Comments: Cameron Williams is a 48 y.o. male who presents to the Emergency Department complaining of influenza-like symptoms including fever (Tmax 102.6 measured at home, 103.2 measured upon arrival to the ED, improved to 99.5 after receiving Tylenol in the ED) with associated dry cough, mild shortness of breath, diffuse myalgias, decreased appetite, and fatigue onset 2 days ago. He reports taking Robitussin at home with moderate, temporary relief of the cough. Patient also reports some burning dysuria. He denies sore throat, congestion, rhinorrhea, nausea, emesis, abdominal pain,  hematuria, neck stiffness. He denies any known sick contacts. Patient has no other pertinent medical history.   History reviewed. No pertinent past medical history. History reviewed. No pertinent past surgical history. No family history on file. History  Substance Use Topics  . Smoking status: Current Every Day Smoker  . Smokeless tobacco: Not on file  . Alcohol Use: Yes     Comment: occasional    Review of Systems  Constitutional: Positive for fever, appetite change (decreased) and fatigue.  HENT: Negative for congestion, rhinorrhea and sore throat.   Respiratory: Positive for cough and shortness of breath.   Gastrointestinal: Negative for nausea, vomiting and abdominal pain.  Genitourinary: Positive for dysuria. Negative for hematuria.  Musculoskeletal: Positive for myalgias. Negative for neck stiffness.  All other systems reviewed and are negative.   Allergies  Review of patient's allergies indicates no known allergies.  Home Medications   Current Outpatient Rx  Name  Route  Sig  Dispense  Refill  . traMADol (ULTRAM) 50 MG tablet   Oral   Take 50 mg by mouth every 6 (six) hours as needed (pain.).         Marland Kitchen guaifenesin (ROBITUSSIN) 100 MG/5ML syrup   Oral   Take 5-10 mLs (100-200 mg total) by mouth every 4 (four) hours as needed for cough.   60 mL   0   . ibuprofen (ADVIL,MOTRIN) 600 MG tablet   Oral   Take 1 tablet (600 mg total) by mouth every 6 (six) hours  as needed.   30 tablet   0   . predniSONE (DELTASONE) 20 MG tablet   Oral   Take 2 tablets (40 mg total) by mouth daily.   10 tablet   0    Triage Vitals: BP 125/82  Pulse 90  Temp(Src) 103.2 F (39.6 C) (Oral)  Resp 20  Ht 6\' 2"  (1.88 m)  Wt 170 lb (77.111 kg)  BMI 21.82 kg/m2  SpO2 94%  Physical Exam  Nursing note and vitals reviewed. Constitutional: He is oriented to person, place, and time. He appears well-developed and well-nourished. No distress.  Nontoxic/nonseptic appearing  HENT:   Head: Normocephalic and atraumatic.  Mouth/Throat: Oropharynx is clear and moist. No oropharyngeal exudate.  Oropharynx clear. Patient tolerating secretions without difficulty or drooling  Eyes: Conjunctivae and EOM are normal. Pupils are equal, round, and reactive to light. No scleral icterus.  Neck: Normal range of motion.  No nuchal rigidity or meningismus  Cardiovascular: Normal rate, regular rhythm and normal heart sounds.   Pulmonary/Chest: Effort normal and breath sounds normal. No respiratory distress. He has no wheezes. He has no rales.  No tachypnea, dyspnea, retractions, or accessory muscle use  Abdominal: Soft. He exhibits no distension and no mass. There is no tenderness. There is no rebound and no guarding.  Soft and nontender without peritoneal signs  Musculoskeletal: Normal range of motion.  Neurological: He is alert and oriented to person, place, and time.  GCS 15. Speech goal oriented. Patient moves extremities without ataxia.  Skin: Skin is warm and dry. No rash noted. He is not diaphoretic. No erythema. No pallor.  Psychiatric: He has a normal mood and affect. His behavior is normal.    ED Course  Procedures (including critical care time)  DIAGNOSTIC STUDIES: Oxygen Saturation is 94% on room air, adequate by my interpretation.    COORDINATION OF CARE: 10:25 PM- Ordered Tylenol to manage symptoms. Discussed that chest x-ray indicates some mild bronchitic changes, but was negative for pneumonia. Discussed pros and cons of Tamiflu. Patient agrees that he does not want a prescription for this. Will order UA and an influenza test. Advised of symptomatic care. Discussed treatment plan with patient at bedside and patient verbalized agreement.    Labs Review Labs Reviewed  URINALYSIS, ROUTINE W REFLEX MICROSCOPIC - Abnormal; Notable for the following:    APPearance CLOUDY (*)    Protein, ur 30 (*)    All other components within normal limits  URINE MICROSCOPIC-ADD ON   INFLUENZA PANEL BY PCR (TYPE A & B, H1N1)    Imaging Review Dg Chest 2 View  02/17/2014   CLINICAL DATA:  Two day history of cough, chest congestion, shortness of breath and chest pain.  EXAM: CHEST  2 VIEW  COMPARISON:  None.  FINDINGS: Cardiomediastinal silhouette unremarkable. Mildly prominent bronchovascular markings diffusely and mild central peribronchial thickening. Lungs otherwise clear. No localized airspace consolidation. No pleural effusions. No pneumothorax. Normal pulmonary vascularity. Degenerative changes involving the thoracic spine.  IMPRESSION: Mild changes of bronchitis and/or asthma without localized airspace pneumonia.   Electronically Signed   By: Hulan Saas M.D.   On: 02/17/2014 22:00     EKG Interpretation None      MDM   Final diagnoses:  Flu-like symptoms  Fever    Uncomplicated flulike illness. Patient nontoxic and nonseptic appearing. He is hemodynamically stable. Patient afebrile on arrival to 103.51F. Fever responding to antipyretics. Physical exam unremarkable. Patient with no evidence of focal consolidation or pneumonia on  chest x-ray. No tachypnea, dyspnea, or hypoxia appreciated. No retractions or accessory muscle use. Patient with complaints of dysuria; however, urinalysis today does not suggest infection. Influenza by PCR pending.  Symptoms consistent with viral, flulike illness. Patient appropriate for discharge today with albuterol inhaler, prednisone course, prescription for Robitussin, and instruction to take Advil or Tylenol for fever control and myalgias. Return precautions provided and patient agreeable to plan with no unaddressed concerns.  I personally performed the services described in this documentation, which was scribed in my presence. The recorded information has been reviewed and is accurate.  Filed Vitals:   02/17/14 2039 02/17/14 2104 02/17/14 2224 02/17/14 2347  BP: 125/82   113/71  Pulse: 90   87  Temp: 103.2 F (39.6 C)   99.5 F (37.5 C) 100.3 F (37.9 C)  TempSrc: Oral  Oral Oral  Resp: 20   20  Height:  6\' 2"  (1.88 m)    Weight:  170 lb (77.111 kg)    SpO2: 94%   96%       Antony MaduraKelly Symphony Demuro, PA-C 02/18/14 0020

## 2014-02-17 NOTE — ED Notes (Signed)
Pt c/o generalized body aches, dry cough, HA, fever x 3 days.

## 2014-02-17 NOTE — Discharge Instructions (Signed)

## 2014-02-17 NOTE — ED Notes (Signed)
Pt presents with family with c/o fever, generalized weakness, not able to get out of bed today.

## 2014-02-18 LAB — INFLUENZA PANEL BY PCR (TYPE A & B)
H1N1 flu by pcr: NOT DETECTED
Influenza A By PCR: NEGATIVE
Influenza B By PCR: NEGATIVE

## 2014-02-19 NOTE — ED Provider Notes (Signed)
Medical screening examination/treatment/procedure(s) were performed by non-physician practitioner and as supervising physician I was immediately available for consultation/collaboration.   EKG Interpretation None        Suzi RootsKevin E Mccall Lomax, MD 02/19/14 [phone]

## 2014-07-22 IMAGING — CR DG HAND COMPLETE 3+V*R*
3 series · 3 of 3 positions shown · non-contrast
Comparison: None.

CLINICAL DATA: Laceration right ring finger.

EXAM:
RIGHT HAND - COMPLETE 3+ VIEW

[x hand pa right]
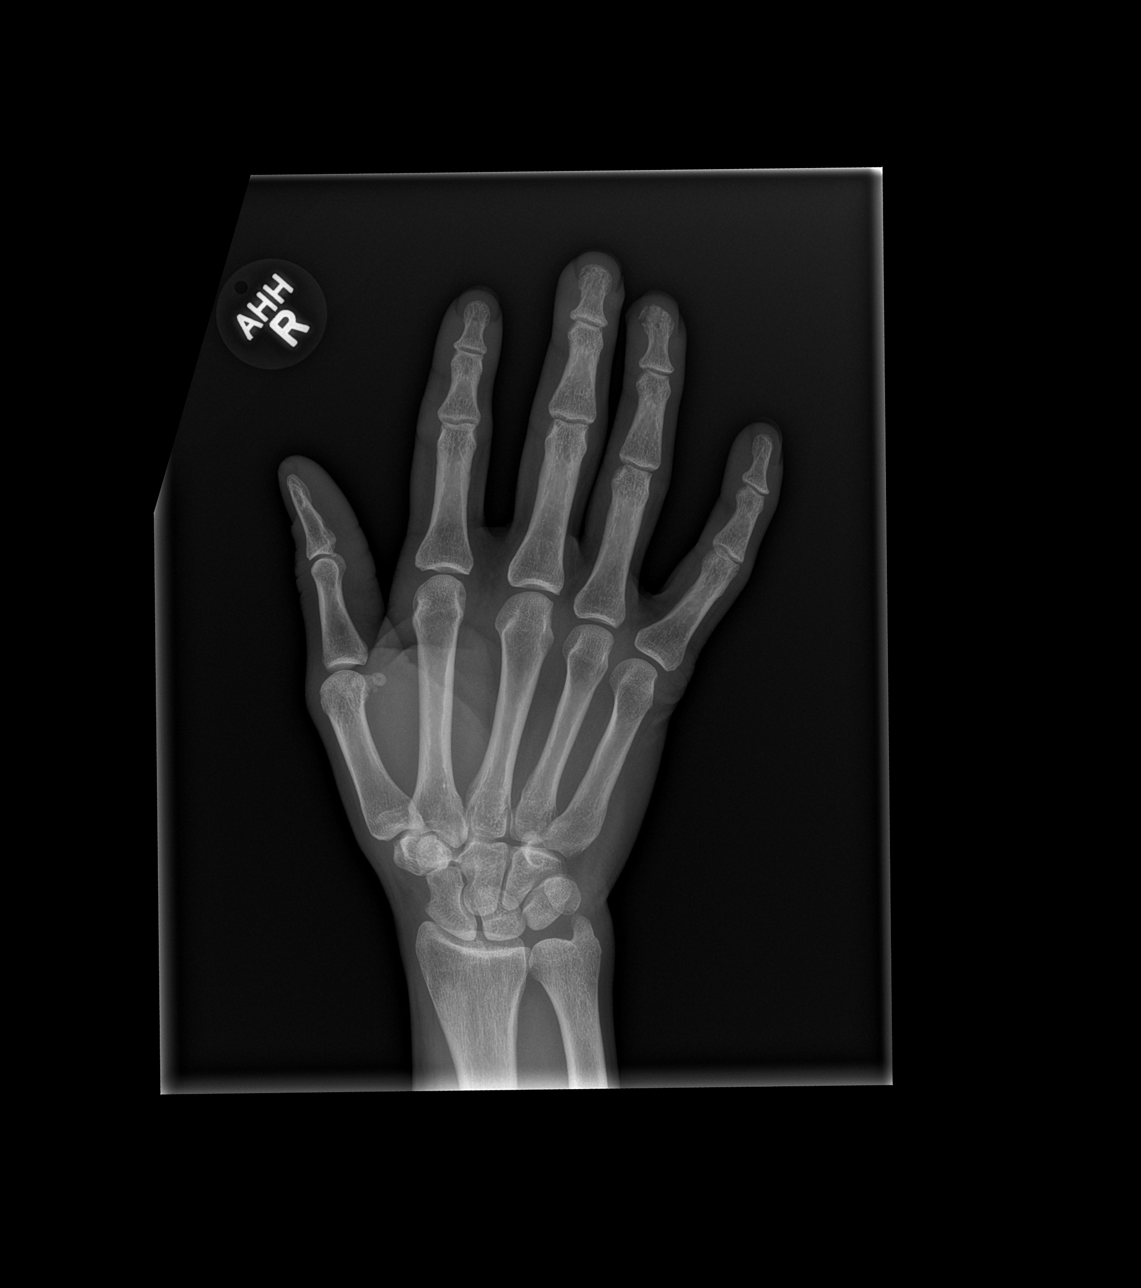

[x hand obl right]
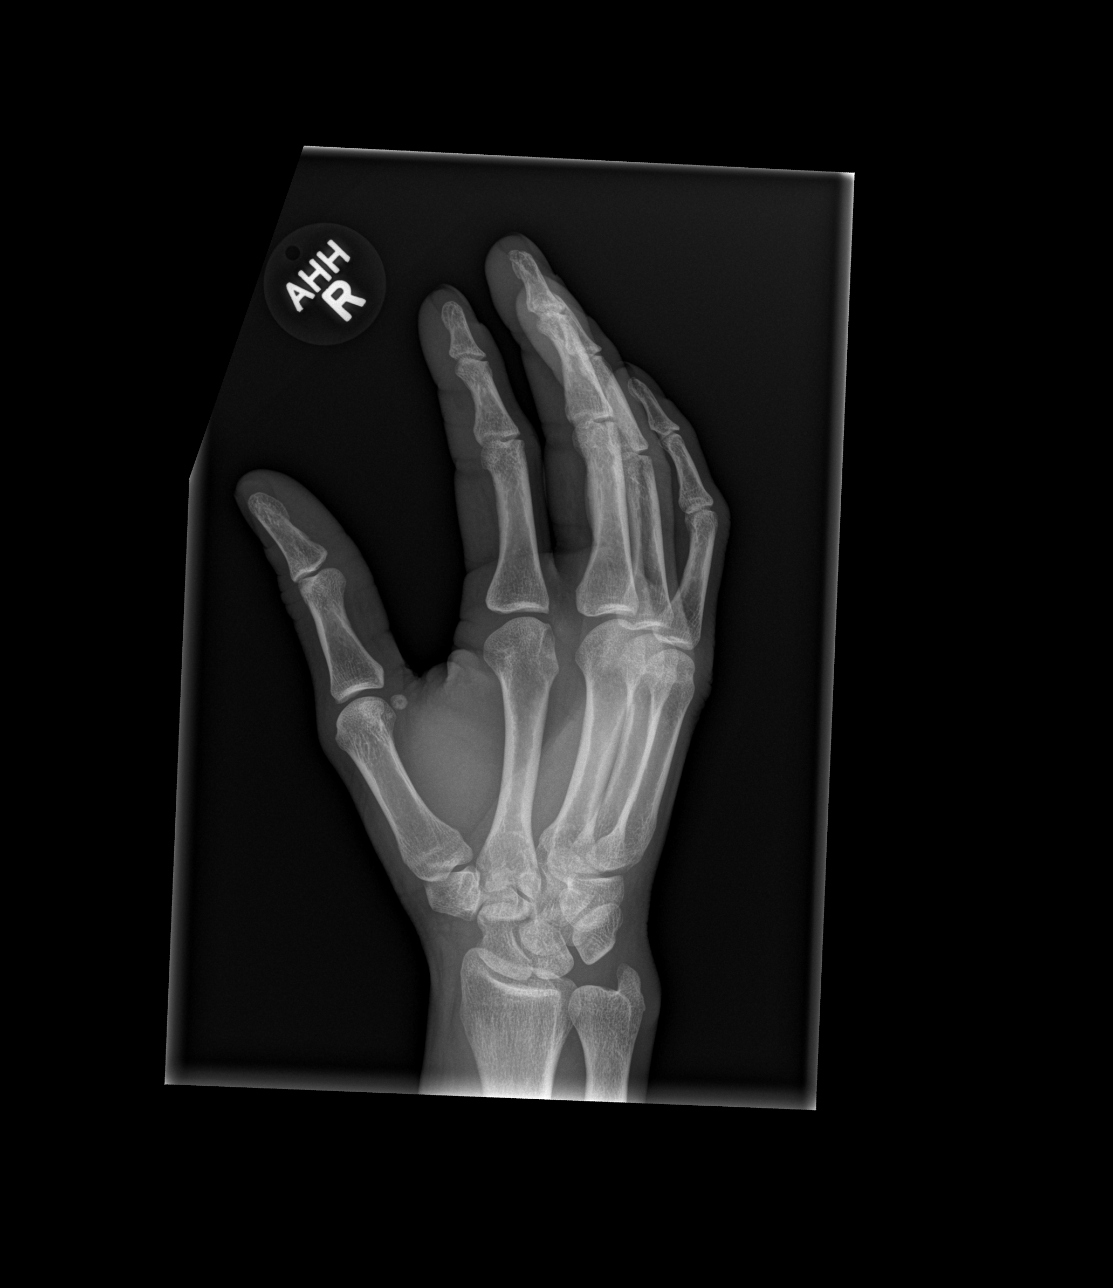

[x hand lat right]
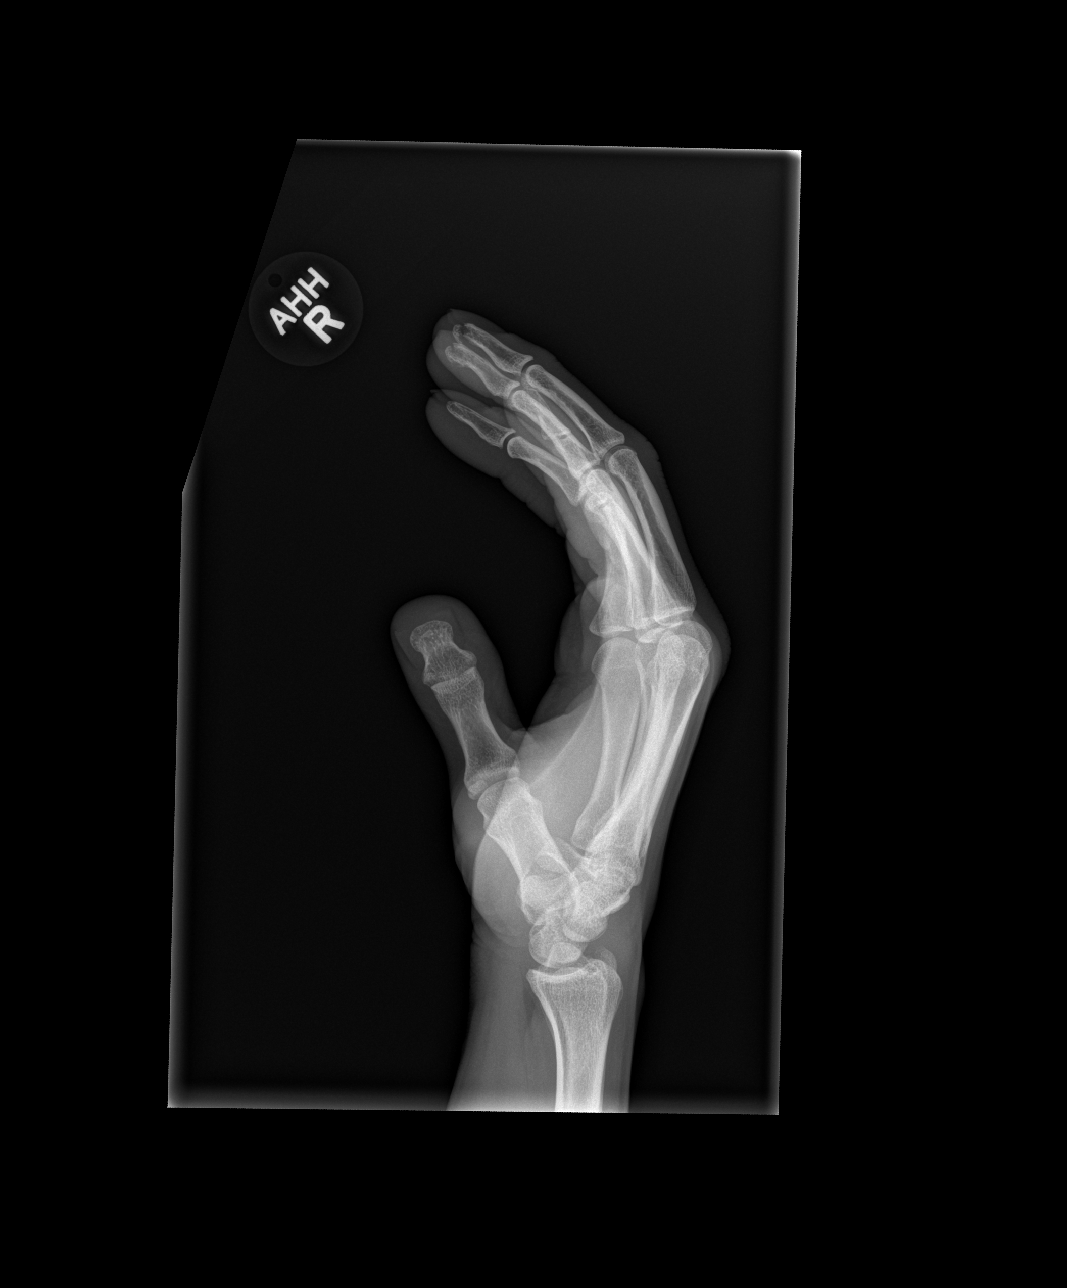

[3 of 3 positions shown; findings below may reference images not displayed]

FINDINGS: The patient has fractures of the [REDACTED] of the right long and ring
fingers. No foreign body is identified. No other acute bony or joint
abnormality is seen.
IMPRESSION: Fractures of the [REDACTED] of the long and ring fingers on the right.

## 2023-08-24 ENCOUNTER — Emergency Department (HOSPITAL_COMMUNITY): Payer: Self-pay

## 2023-08-24 ENCOUNTER — Encounter (HOSPITAL_COMMUNITY): Payer: Self-pay

## 2023-08-24 ENCOUNTER — Emergency Department (HOSPITAL_COMMUNITY)
Admission: EM | Admit: 2023-08-24 | Discharge: 2023-08-24 | Disposition: A | Discharge status: Home / Self Care | Payer: Self-pay | Attending: Emergency Medicine | Admitting: Emergency Medicine

## 2023-08-24 ENCOUNTER — Ambulatory Visit (HOSPITAL_COMMUNITY)
Admission: RE | Admit: 2023-08-24 | Discharge: 2023-08-24 | Disposition: A | Discharge status: Home / Self Care | Payer: Self-pay | Source: Ambulatory Visit | Attending: Emergency Medicine | Admitting: Emergency Medicine

## 2023-08-24 DIAGNOSIS — R55 Syncope and collapse: Secondary | ICD-10-CM | POA: Insufficient documentation

## 2023-08-24 DIAGNOSIS — R519 Headache, unspecified: Secondary | ICD-10-CM | POA: Insufficient documentation

## 2023-08-24 DIAGNOSIS — Z20822 Contact with and (suspected) exposure to covid-19: Secondary | ICD-10-CM | POA: Insufficient documentation

## 2023-08-24 LAB — BASIC METABOLIC PANEL
Anion gap: 6 (ref 5–15)
BUN: 11 mg/dL (ref 6–20)
CO2: 20 mmol/L — ABNORMAL LOW (ref 22–32)
Calcium: 6.6 mg/dL — ABNORMAL LOW (ref 8.9–10.3)
Chloride: 115 mmol/L — ABNORMAL HIGH (ref 98–111)
Creatinine, Ser: 0.58 mg/dL — ABNORMAL LOW (ref 0.61–1.24)
GFR, Estimated: >60 mL/min (ref >60)
Glucose, Bld: 81 mg/dL (ref 70–99)
Potassium: 3.8 mmol/L (ref 3.5–5.1)
Sodium: 141 mmol/L (ref 135–145)

## 2023-08-24 LAB — CBC
HCT: 42.1 % (ref 39.0–52.0)
Hemoglobin: 13.3 g/dL (ref 13.0–17.0)
MCH: 27.8 pg (ref 26.0–34.0)
MCHC: 31.6 g/dL (ref 30.0–36.0)
MCV: 87.9 fL (ref 80.0–100.0)
Platelets: 115 10*3/uL — ABNORMAL LOW (ref 150–400)
RBC: 4.79 MIL/uL (ref 4.22–5.81)
RDW: 13.7 % (ref 11.5–15.5)
WBC: 6.5 10*3/uL (ref 4.0–10.5)
nRBC: 0 % (ref 0.0–0.2)

## 2023-08-24 LAB — SARS CORONAVIRUS 2 BY RT PCR: SARS Coronavirus 2 by RT PCR: NEGATIVE

## 2023-08-24 LAB — CBG MONITORING, ED
Glucose-Capillary: 110 mg/dL — ABNORMAL HIGH (ref 70–99)
Glucose-Capillary: 148 mg/dL — ABNORMAL HIGH (ref 70–99)

## 2023-08-24 MED ORDER — CALCIUM GLUCONATE-NACL 1-0.675 GM/50ML-% IV SOLN
1.0000 g | Freq: Once | INTRAVENOUS | Status: AC
  Administered 2023-08-24: 1000 mg via INTRAVENOUS
  Filled 2023-08-24: qty 50

## 2023-08-24 MED ORDER — IOHEXOL 350 MG/ML SOLN
80.0000 mL | Freq: Once | INTRAVENOUS | Status: AC | PRN
  Administered 2023-08-24: 80 mL via INTRAVENOUS

## 2023-08-24 MED ORDER — CALCIUM CARBONATE ANTACID 500 MG PO CHEW
1.0000 | CHEWABLE_TABLET | Freq: Two times a day (BID) | ORAL | 0 refills | Status: AC
Start: 2023-08-24 — End: 2023-09-23

## 2023-08-24 NOTE — ED Triage Notes (Signed)
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 Had HA yesterday, took goody powder that helped. Today pain came back, got clammy, couldn't move. Resolved in about 15m. 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 1 episode emesis. 800ns 20ga LF  90 palp to 113/70 HR 50 CBG 162 98% RA RR 18

## 2023-08-24 NOTE — ED Provider Notes (Signed)
[place] EMERGENCY DEPARTMENT AT [facility] Provider Note   CSN: [id number] Arrival date & time: 08/24/23  1049     History  Chief Complaint  Patient presents with   Headache   Dizziness    Cameron Williams is a 57 y.o. male presented to the ED with headache, dizziness.  Patient reports a gradual onset of headache yesterday afternoon around 4 PM.  He says the headache progressively worsened until he took Goody's powder, and then improved and was able to sleep.  He woke up again with a headache this morning and went to work.  At work he feels his headache intensified, began to feel nauseated, flushed and sweat.  EMS was called to the scene as the patient reports the symptoms were similar to when he was diagnosed with a stroke in 2012.  He felt there was a period of time or "I could not move".  He was given some fluids by EMS.  Since arriving in the ER he says his headache feels significantly better.  It is quite minimal pain at this time.  He denies photophobia, blurred vision, numbness or weakness of the face hands or feet.  He denies neck stiffness  He denies history of persistent headaches or migraines.  He denies any recent fevers, chills, upper URI symptoms or infectious symptoms.  He does smoke black and milds on a regular basis.  He has a history of smoking.  He is unaware of any history of any other medical conditions but has not seen a doctor in about a decade.  He says he does not know of any history of diabetes, hypertension or hyperlipidemia.  Medical chart review shows patient was admitted to the hospital with concern for stroke in October 2012.  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  There were also extensive subcortical T2 flair hyperintensities within the frontal parietal lobes bilaterally which were nonspecific.  Patient  does not take any medications on a daily basis.  HPI     Home Medications Prior to Admission medications   Medication Sig Start Date End Date Taking? Authorizing Provider  calcium carbonate (TUMS) 500 MG chewable tablet Chew 1 tablet (200 mg of elemental calcium total) by mouth 2 (two) times daily. 08/24/23 09/23/23 Yes Shan Padgett, Kermit Balo, MD  guaifenesin (ROBITUSSIN) 100 MG/5ML syrup Take 5-10 mLs (100-200 mg total) by mouth every 4 (four) hours as needed for cough. 02/17/14   Antony Madura, PA-C  ibuprofen (ADVIL,MOTRIN) 600 MG tablet Take 1 tablet (600 mg total) by mouth every 6 (six) hours as needed. 02/17/14   Antony Madura, PA-C  predniSONE (DELTASONE) 20 MG tablet Take 2 tablets (40 mg total) by mouth daily. 02/17/14   Antony Madura, PA-C  traMADol (ULTRAM) 50 MG tablet Take 50 mg by mouth every 6 (six) hours as needed (pain.).    [provider]      Allergies    Patient has no known allergies.    Review of Systems   Review of Systems  Physical Exam Updated Vital Signs BP 102/73   Pulse (!) 56   Temp 97.9 F (36.6 C) (Oral)   Resp 16   Ht 6' (1.829 m)   SpO2 100%   BMI 23.06 kg/m  Physical Exam Constitutional:      General: He is not in acute distress. HENT:     Head:  Normocephalic and atraumatic.  Eyes:     General: No visual field deficit.    Conjunctiva/sclera: Conjunctivae normal.     Pupils: Pupils are equal, round, and reactive to light.  Cardiovascular:     Rate and Rhythm: Normal rate and regular rhythm.  Pulmonary:     Effort: Pulmonary effort is normal. No respiratory distress.  Abdominal:     General: There is no distension.     Tenderness: There is no abdominal tenderness.  Musculoskeletal:     Cervical back: Normal range of motion and neck supple.  Lymphadenopathy:     Cervical: No cervical adenopathy.  Skin:    General: Skin is warm and dry.  Neurological:     General: No focal deficit present.     Mental Status: He is alert. Mental  status is at baseline.     GCS: GCS eye subscore is 4. GCS verbal subscore is 5. GCS motor subscore is 6.     Cranial Nerves: No cranial nerve deficit, dysarthria or facial asymmetry.     Sensory: No sensory deficit.     Motor: No weakness.     Coordination: Romberg sign negative. Coordination normal.  Psychiatric:        Mood and Affect: Mood normal.        Behavior: Behavior normal.     ED Results / Procedures / Treatments   Labs (all labs ordered are listed, but only abnormal results are displayed) Labs Reviewed  BASIC METABOLIC PANEL - Abnormal; Notable for the following components:      Result Value   Chloride 115 (*)    CO2 20 (*)    Creatinine, Ser 0.58 (*)    Calcium 6.6 (*)    All other components within normal limits  CBC - Abnormal; Notable for the following components:   Platelets 115 (*)    All other components within normal limits  CBG MONITORING, ED - Abnormal; Notable for the following components:   Glucose-Capillary 110 (*)    All other components within normal limits  CBG MONITORING, ED - Abnormal; Notable for the following components:   Glucose-Capillary 148 (*)    All other components within normal limits  SARS CORONAVIRUS 2 BY RT PCR  CBG MONITORING, ED    EKG EKG Interpretation Date/Time:  Monday August 24 2023 15:57:03 EDT Ventricular Rate:  48 PR Interval:  130 QRS Duration:  103 QT Interval:  452 QTC Calculation: 404 R Axis:   70  Text Interpretation: Sinus bradycardia minor ST elevation in anterior leads are likely physiological for age and gender, consistent with benign early repolarization. No sig change from Sep 12 2011 tracing Confirmed by Alvester Chou [id number]) on 08/24/2023 4:17:36 PM  Radiology CT ANGIO HEAD NECK W WO CM  Result Date: 08/24/2023 CLINICAL DATA:  Transient ischemic attack. Headache and lightheadedness. EXAM: CT ANGIOGRAPHY HEAD AND NECK WITH AND WITHOUT CONTRAST TECHNIQUE: Multidetector CT imaging of the head and  neck was performed using the standard protocol during bolus administration of intravenous contrast. Multiplanar CT image reconstructions and MIPs were obtained to evaluate the vascular anatomy. Carotid stenosis measurements (when applicable) are obtained utilizing NASCET criteria, using the distal internal carotid diameter as the denominator. RADIATION DOSE REDUCTION: This exam was performed according to the departmental dose-optimization program which includes automated exposure control, adjustment of the mA and/or kV according to patient size and/or use of iterative reconstruction technique. CONTRAST:  80mL OMNIPAQUE IOHEXOL 350 MG/ML SOLN COMPARISON:  MRI 09/12/2011.  Head  CT 06/17/2022. FINDINGS: CT HEAD FINDINGS Brain: Normal CT appearance of the brain. No evidence of old or acute infarction, mass lesion, hemorrhage, hydrocephalus or extra-axial collection. Small vessel cerebellar infarctions which were acute in 2012 on the right are not visible. Vascular: No abnormal vascular finding. Skull: Negative Sinuses/Orbits: Clear/normal Other: None Review of the MIP images confirms the above findings CTA NECK FINDINGS Aortic arch: Normal. No atherosclerotic change. Normal branching pattern. Right carotid system: Common carotid artery widely patent to the bifurcation. Normal carotid bifurcation and cervical ICA. Left carotid system: Left carotid system similarly normal. Vertebral arteries: Both vertebral artery origins are widely patent. Both vessels are widely patent through the cervical region to the foramen magnum. Skeleton: Mild cervical spondylosis. Dental and periodontal disease. Other neck: No mass or lymphadenopathy. Upper chest: Negative Review of the MIP images confirms the above findings CTA HEAD FINDINGS Anterior circulation: Both internal carotid arteries are widely patent through the skull base and siphon regions. No siphon stenosis. The anterior and middle cerebral vessels are normal. No large vessel  occlusion or proximal stenosis. No aneurysm or vascular malformation. Posterior circulation: Both vertebral arteries widely patent through the foramen magnum to the basilar artery. No basilar stenosis. Posterior circulation branch vessels are patent. Venous sinuses: Patent and normal. Anatomic variants: None significant. Review of the MIP images confirms the above findings IMPRESSION: 1. Normal CT appearance of the brain itself. Small cerebellar infarctions that occurred in 2012 are not visible by CT. 2. Normal CT angiography of the neck. 3. Normal CT angiography of the head. 4. Dental and periodontal disease. Electronically Signed   By: Paulina Fusi M.D.   On: 08/24/2023 15:50    Procedures Procedures    Medications Ordered in ED Medications  iohexol (OMNIPAQUE) 350 MG/ML injection 80 mL (80 mLs Intravenous Contrast Given 08/24/23 1421)  calcium gluconate 1 g/ 50 mL sodium chloride IVPB (0 mg Intravenous Stopped 08/24/23 1804)    ED Course/ Medical Decision Making/ A&P Clinical Course as of 08/24/23 1805  Mon Aug 24, 2023  1553 CT imaging with no emergent findings.  Patient is now pending MRI for tia eval; currently he has no active headache [MT]    Clinical Course User Index [MT] Lenaya Pietsch, Kermit Balo, MD                                 Medical Decision Making Amount and/or Complexity of Data Reviewed Labs: ordered. Radiology: ordered.  Risk OTC drugs. Prescription drug management.   This patient presents to the ED with concern for headache, paresthesias, weakness. This involves an extensive number of treatment options, and is a complaint that carries with it a high risk of complications and morbidity.  The differential diagnosis includes CVA versus complex migraine versus viral URI versus infectious process versus other  Co-morbidities that complicate the patient evaluation: History of prior stroke of unclear etiology, at high risk of recurring stroke or thromboembolic  events.  Additional history obtained from EMS  External records from outside source obtained and reviewed including MRI and MRA imaging from October 2012, prior stroke admission  I ordered and personally interpreted labs.  The pertinent results include: No emergent findings, incidental hypocalcemia is noted  I ordered imaging studies including CTA head and neck I independently visualized and interpreted imaging which showed no emergent findings I agree with the radiologist interpretation  The patient was maintained on a cardiac monitor.  I personally  viewed and interpreted the cardiac monitored which showed an underlying rhythm of: Regular heart rate  Per my interpretation the patient's ECG shows no acute ischemic findings  I ordered medication including IV calcium for repletion, IV fluids for hydration  I have reviewed the patients home medicines and have made adjustments as needed  Test Considered: Low suspicion for acute meningitis.  No indication for lumbar puncture at this time  After the interventions noted above, I reevaluated the patient and found that they have: improved -patient reporting headache and completely resolved upon his arrival  Social Determinants of Health: patient counseled on importance of smoking cessation  Dispostion:  Patient is signed out to Dr Marlene Bast EDP at 5 pm pending follow-up on MRI imaging.  If MRI is unrevealing, patient could likely be discharged home.  Calcium supplements were provided for the hypocalcemia.  Patient has been encouraged to schedule follow-up with both PCP for his general health screening as well as a neurologist for his prior history of stroke and now potential complex migraine or headache.  Patient verbalized understanding.         Final Clinical Impression(s) / ED Diagnoses Final diagnoses:  Nonintractable headache, unspecified chronicity pattern, unspecified headache type  Near syncope  Hypocalcemia    Rx / DC  Orders ED Discharge Orders          Ordered    calcium carbonate (TUMS) 500 MG chewable tablet  2 times daily        08/24/23 1802              Terald Sleeper, MD 08/24/23 [phone]

## 2023-08-24 NOTE — ED Provider Notes (Signed)
Handoff received from oncoming provider.  Patient presenting with history of prior stroke, today he felt flushed, diaphoretic and felt like he could not move.  Similar to prior stroke.  He had headache earlier which is since resolved.  No fevers, low suspicion for infection.  Also had low calcium which has been repleted.  CT head and neck was reassuring, MRI ordered and pending.  Plan for discharge home if MRI reassuring.   On reassessment patient states he needs to go home to let his dog out.  He does not want stay for MRI.  I discussed with him that we could miss a stroke, and if we miss a stroke this could lead to permanent disability, death, need for additional hospitalization.  He voiced understanding, and was persistently would like to leave against medical vice.  I recommend he follow closely with neurology and his PCP, I did counsel him on his low calcium as well but will need to be rechecked.   Laurence Spates, MD 08/24/23 [phone]

## 2023-08-24 NOTE — Discharge Instructions (Addendum)
You had a workup in the ER including brain imaging and blood test for headache and dizziness and weakness.  You felt better in the ER, and you told me that your headache had completely resolved.    I recommended that you follow-up as an outpatient with a neurologist.  This is a brain and headache specialist.  This is important for your general health, given that you have had strokes in the past.    You also should begin looking for a primary care provider.  Your primary care doctor can check your cholesterol levels, blood pressure, and other chronic health conditions, which are not typically done in the emergency department.   Please make every effort to cut back and eliminate smoking.  Smoking is a risk factor for heart disease, lung disease, cancer and stroke.  Please stay hydrated, drinking plenty of water for the next few days and in the future.  *  Finally, your blood tests did show that your calcium level was low today.  This could be due to a variety of reasons.  I prescribed you calcium pills (TUMS) to take twice daily for the next 30 days.

## 2024-04-14 ENCOUNTER — Encounter: Payer: Self-pay | Admitting: Internal Medicine
